# Patient Record
Sex: Female | Born: 1970 | Race: White | Hispanic: No | Marital: Married
Health system: Southern US, Community
[De-identification: ages and names within clinical notes are randomized; demographics above are authoritative.]

## PROBLEM LIST (undated history)

## (undated) ENCOUNTER — Ambulatory Visit: Admission: EM | Payer: Self-pay

---

## 2012-03-21 ENCOUNTER — Ambulatory Visit: Payer: Self-pay | Admitting: Internal Medicine

## 2012-03-21 LAB — RAPID INFLUENZA A&B ANTIGENS

## 2018-01-30 ENCOUNTER — Encounter: Payer: Self-pay | Admitting: *Deleted

## 2018-01-30 ENCOUNTER — Encounter (INDEPENDENT_AMBULATORY_CARE_PROVIDER_SITE_OTHER): Payer: Self-pay

## 2018-01-30 ENCOUNTER — Ambulatory Visit
Admission: RE | Admit: 2018-01-30 | Discharge: 2018-01-30 | Disposition: A | Discharge status: Home / Self Care | Payer: Self-pay | Source: Ambulatory Visit | Attending: Oncology | Admitting: Oncology

## 2018-01-30 ENCOUNTER — Ambulatory Visit: Payer: Self-pay | Attending: Oncology | Admitting: *Deleted

## 2018-01-30 VITALS — BP 134/87 | HR 72 | Temp 98.4°F | Ht 63.0 in | Wt 152.0 lb

## 2018-01-30 DIAGNOSIS — Z0000 Encounter for general adult medical examination without abnormal findings: Secondary | ICD-10-CM

## 2018-01-30 NOTE — Progress Notes (Addendum)
  Subjective:     Patient ID: Jo Hamilton, female   DOB: 11/20/1970, 47 y.o.   MRN: [id number]  HPI   Review of Systems     Objective:   Physical Exam  Pulmonary/Chest: Right breast exhibits no inverted nipple, no mass, no nipple discharge, no skin change and no tenderness. Left breast exhibits no inverted nipple, no mass, no nipple discharge, no skin change and no tenderness.         Assessment:     47 year old White [facility] and [facility].  On clinical breast exam bilateral inner quadrants have a fibroglandular like pattern.  The patient states she has had occasional bilateral nipple discharge on expression only since the birth of her child 16 years ago.  States she has not had any in "a long time".  States greater than a year ago.  States "it looked like the discharge you have before you have a baby".  Taught self breast awareness.  She was encouraged not to try to and express it anymore.  Last pap on 08/20/17 was negative / negative.  Next pap due in 2024.  Patient has been screened for eligibility.  She does not have any insurance, Medicare or Medicaid.  She also meets financial eligibility.  Hand-out given on the Affordable Care Act. Risk Assessment    Risk Scores      01/30/2018   Last edited by: Alta Corningover, Melissa G, CMA   5-year risk: 1.3 %   Lifetime risk: 12.3 %             Plan:     Screening mammogram ordered.  Patient is to call if she has any more discharge and I will reassess at that time. Will follow-up per [facility] protocol

## 2018-01-30 NOTE — Patient Instructions (Signed)
Gave patient hand-out, Women Staying Healthy, Active and Well from [facility], with education on breast health, pap smears, heart and colon health. 

## 2018-02-05 ENCOUNTER — Other Ambulatory Visit: Payer: Self-pay | Admitting: *Deleted

## 2018-02-05 DIAGNOSIS — N63 Unspecified lump in unspecified breast: Secondary | ICD-10-CM

## 2018-02-08 ENCOUNTER — Encounter: Payer: Self-pay | Admitting: Family Medicine

## 2018-02-13 ENCOUNTER — Ambulatory Visit
Admission: RE | Admit: 2018-02-13 | Discharge: 2018-02-13 | Disposition: A | Discharge status: Home / Self Care | Payer: Self-pay | Source: Ambulatory Visit | Attending: Oncology | Admitting: Oncology

## 2018-02-13 DIAGNOSIS — N63 Unspecified lump in unspecified breast: Secondary | ICD-10-CM

## 2018-03-11 ENCOUNTER — Encounter: Payer: Self-pay | Admitting: *Deleted

## 2018-03-11 NOTE — Progress Notes (Unsigned)
Letter mailed from the [facility] to inform patient of her normal mammogram results.  Patient is to follow-up with annual screening in one year.  HSIS to Christy. 

## 2019-04-04 ENCOUNTER — Telehealth: Payer: Self-pay

## 2019-04-07 NOTE — Progress Notes (Signed)
Patient pre-screened for [facility] eligibility due to COVID 19 precautions. Two patient identifiers used for verification that I was speaking to correct patient.  Patient to Present directly to [facility] 04/08/19 for [facility] screening mammogram. 

## 2019-04-08 ENCOUNTER — Ambulatory Visit
Admission: RE | Admit: 2019-04-08 | Discharge: 2019-04-08 | Disposition: A | Discharge status: Home / Self Care | Payer: Self-pay | Source: Ambulatory Visit | Attending: Oncology | Admitting: Oncology

## 2019-04-08 ENCOUNTER — Ambulatory Visit: Payer: Self-pay | Attending: Oncology | Admitting: *Deleted

## 2019-04-08 ENCOUNTER — Encounter: Payer: Self-pay | Admitting: *Deleted

## 2019-04-08 DIAGNOSIS — Z0000 Encounter for general adult medical examination without abnormal findings: Secondary | ICD-10-CM | POA: Insufficient documentation

## 2019-04-08 NOTE — Progress Notes (Signed)
  Subjective:     Patient ID: Jo Hamilton, female   DOB: 01/30/1971, 49 y.o.   MRN: [id number]  HPI   Review of Systems     Objective:   Physical Exam     Assessment:     Due to Covid 19 pandemic a televisit was used to enroll patient into our [facility] program and obtain her health history.  Patient denies any breast problems at this time.  Last pap on 08/20/17 was negative / negative.  Next pap due in 2024.  See Dondra Spry Model breast cancer risk assessment below> Risk Assessment    Risk Scores      04/08/2019 01/30/2018   Last edited by: Scarlett Presto, RN [place], Freada Bergeron, CMA   5-year risk: 1.3 % 1.3 %   Lifetime risk: 12.1 % 12.3 %            Plan:     Patient presented to the [facility] for her screening mammogram today.  Will follow up per [facility] protocol.

## 2019-04-09 ENCOUNTER — Other Ambulatory Visit: Payer: Self-pay | Admitting: *Deleted

## 2019-04-09 DIAGNOSIS — N63 Unspecified lump in unspecified breast: Secondary | ICD-10-CM

## 2019-04-21 ENCOUNTER — Ambulatory Visit: Payer: Self-pay

## 2019-04-28 ENCOUNTER — Ambulatory Visit
Admission: RE | Admit: 2019-04-28 | Discharge: 2019-04-28 | Disposition: A | Discharge status: Home / Self Care | Payer: Self-pay | Source: Ambulatory Visit | Attending: Oncology | Admitting: Oncology

## 2019-04-28 DIAGNOSIS — N63 Unspecified lump in unspecified breast: Secondary | ICD-10-CM

## 2019-05-01 ENCOUNTER — Encounter: Payer: Self-pay | Admitting: *Deleted

## 2019-05-01 NOTE — Progress Notes (Signed)
Letter mailed from the [facility] to inform patient of her normal mammogram results.  Patient is to follow-up with annual screening in one year. 

## 2020-07-22 IMAGING — MG DIGITAL SCREENING BILAT W/ TOMO W/ CAD
8 series · 8 of 24 positions shown · non-contrast
Comparison: Previous exam(s).

CLINICAL DATA: Screening.

EXAM:
DIGITAL SCREENING BILATERAL MAMMOGRAM WITH TOMO AND CAD

[R MLO synth-2D]
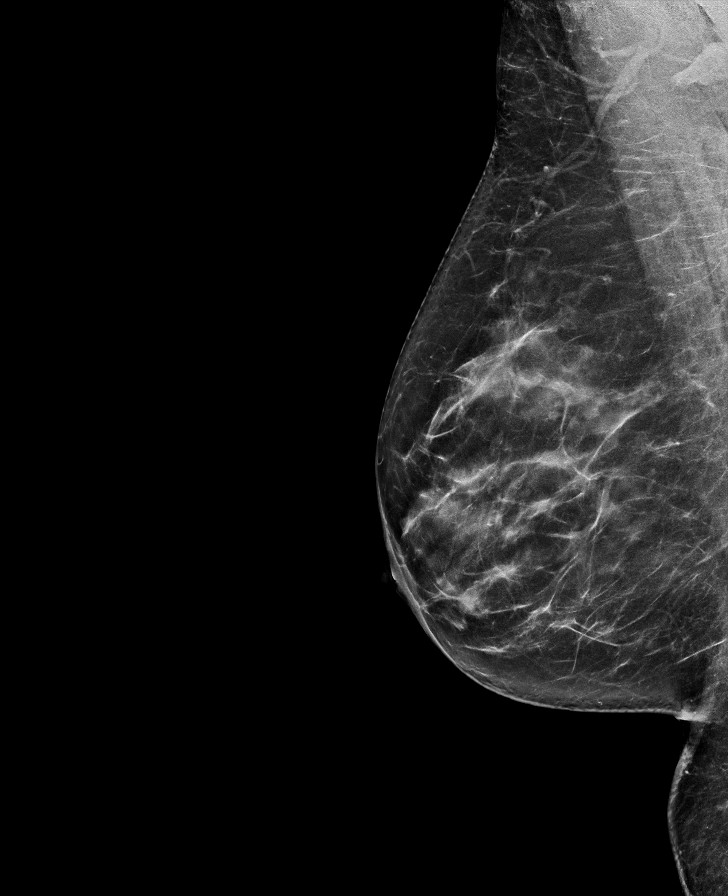

[L CC synth-2D]
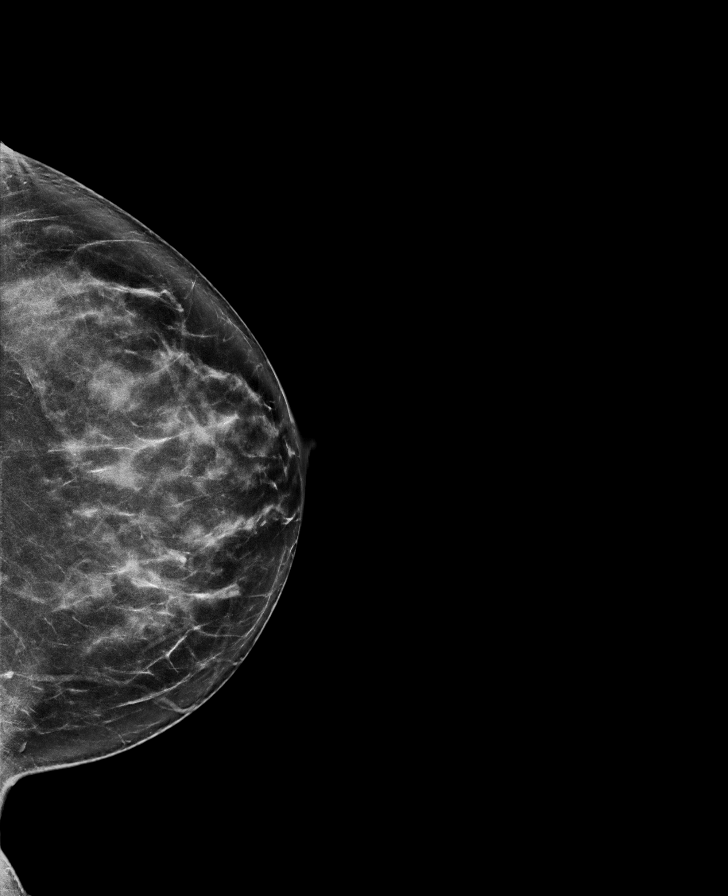

[R CC synth-2D]
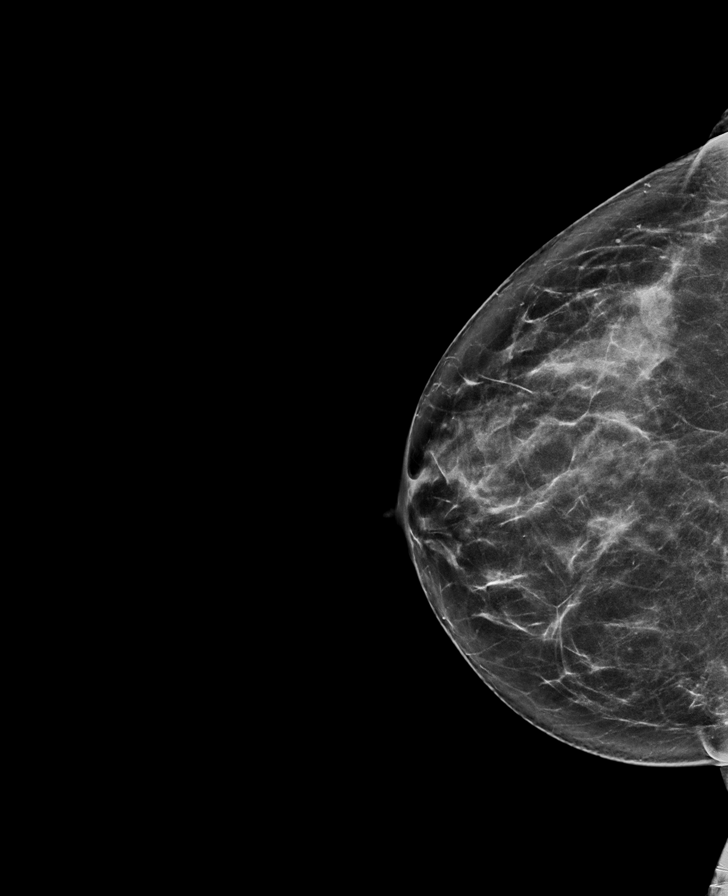

[L MLO synth-2D]
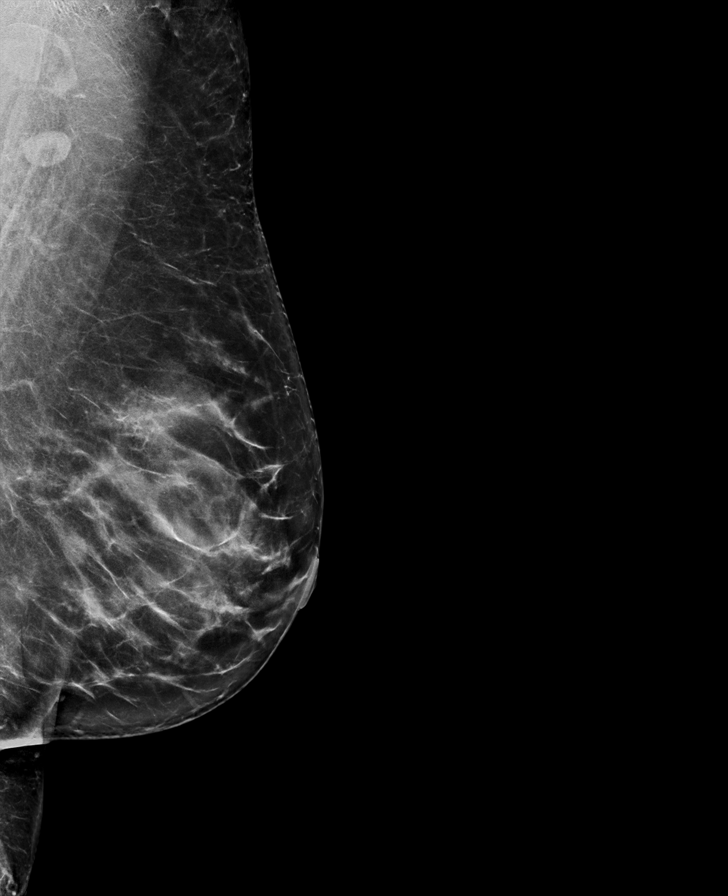

[R MLO tomo · tomo slice 37/72.0]
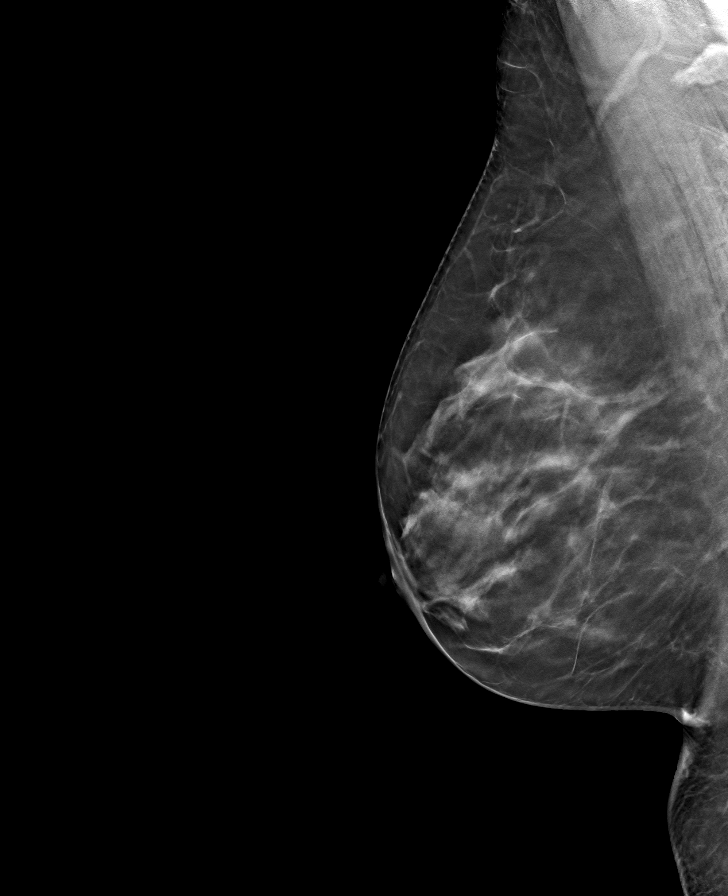

[R CC tomo · tomo slice 35/70.0]
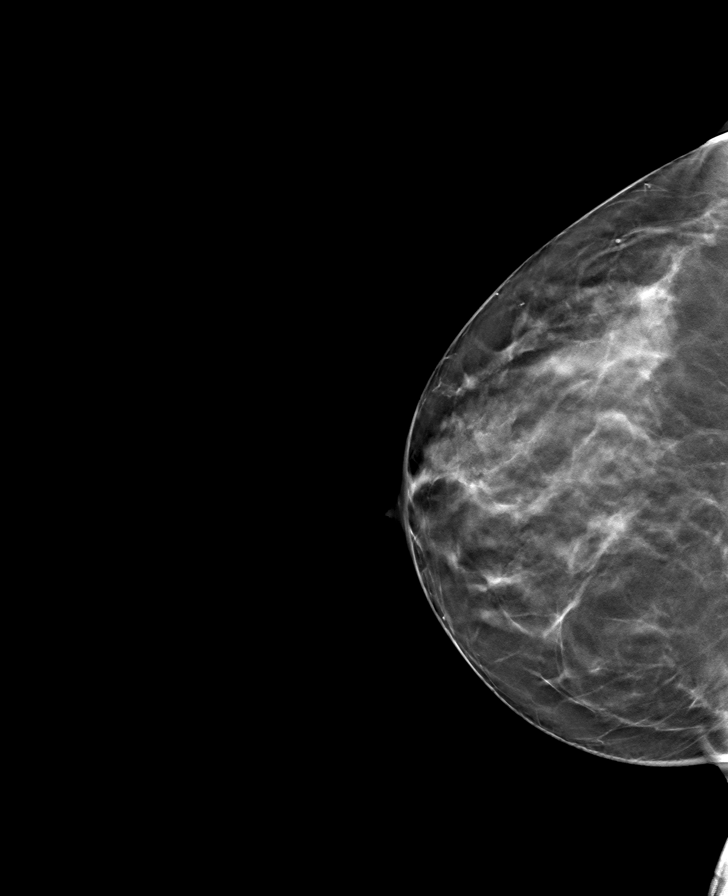

[L MLO tomo · tomo slice 41/80.0]
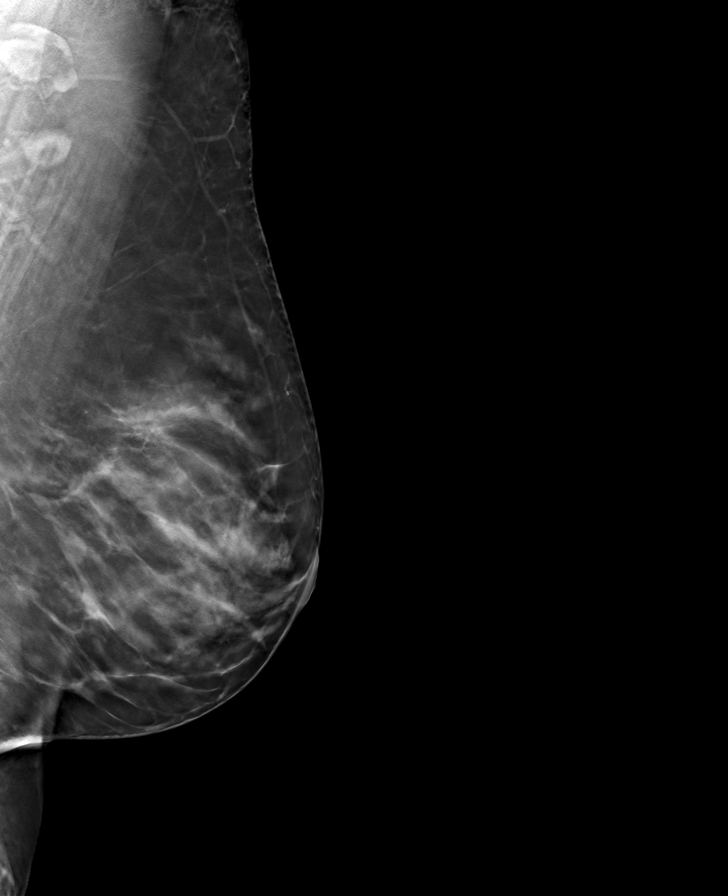

[L CC tomo · tomo slice 37/74.0]
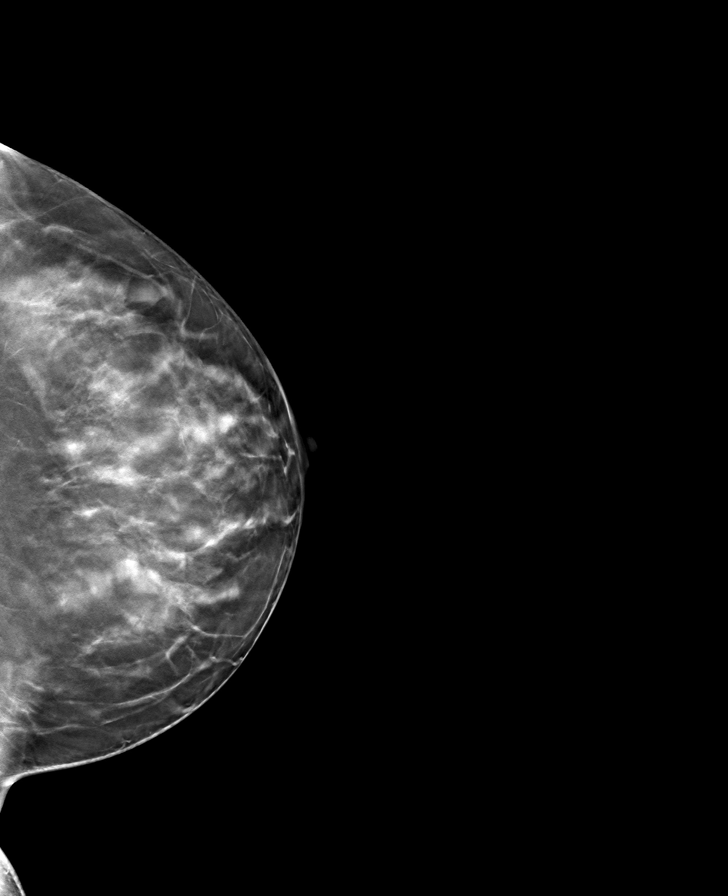

[8 of 24 positions shown; findings below may reference images not displayed]

ACR Breast Density Category c: The breast tissue is heterogeneously
dense, which may obscure small masses.
FINDINGS: In the left breast, a possible mass warrants further evaluation. In
the right breast, no findings suspicious for malignancy. Images were
processed with CAD.
IMPRESSION: Further evaluation is suggested for possible mass in the left
breast.

RECOMMENDATION:
Ultrasound of the left breast. (Code:[id number])

The patient will be contacted regarding the findings, and additional
imaging will be scheduled.

BI-RADS CATEGORY  0: Incomplete. Need additional imaging evaluation
and/or prior mammograms for comparison.

## 2020-08-11 IMAGING — US US BREAST*L* LIMITED INC AXILLA
1 series · 7 of 7 positions shown · non-contrast
Comparison: Previous exam(s).

CLINICAL DATA: Recall from screening to evaluate an oval
circumscribed mass over the outer midportion of the left breast.

EXAM:
ULTRASOUND OF THE LEFT BREAST

[Series 1: us breast*left* limited inc axilla · 0.07mm/px · 7 of 7 slices shown]
[im 1/7]
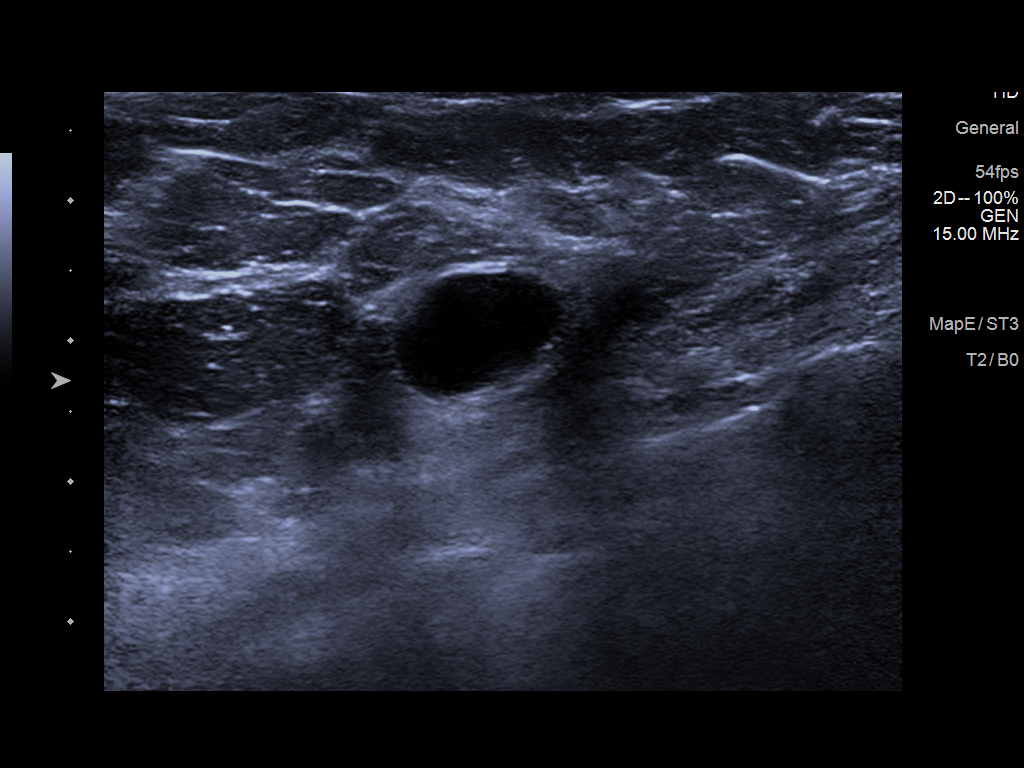
[im 2/7]
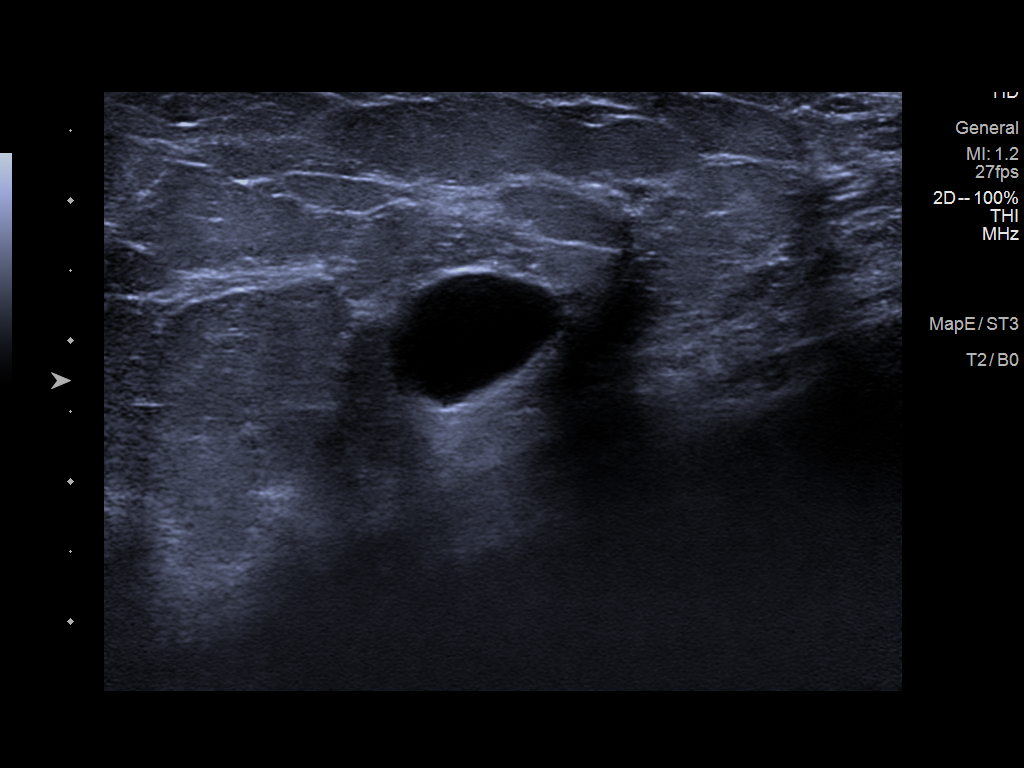
[im 3/7]
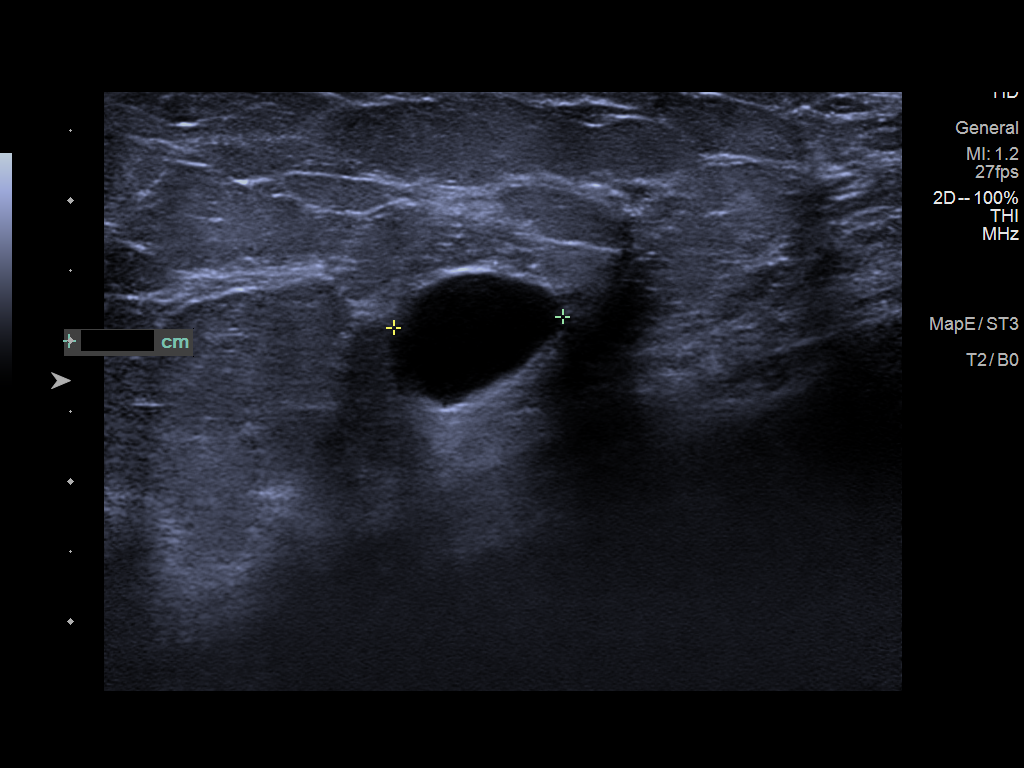
[im 4/7]
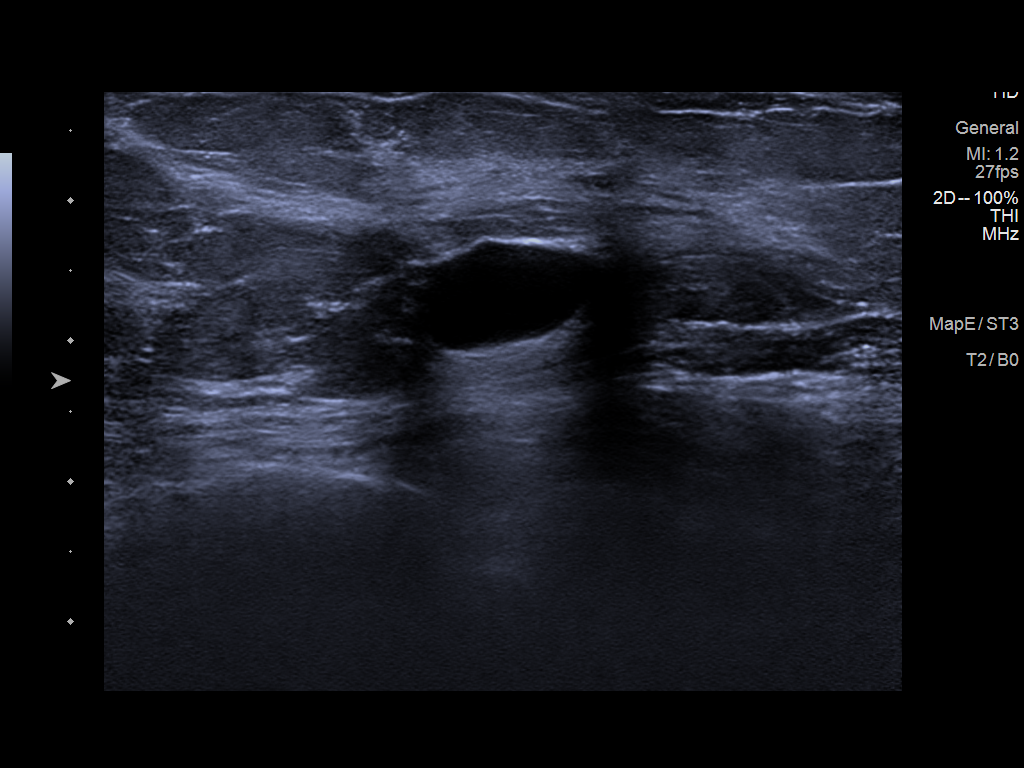
[im 5/7]
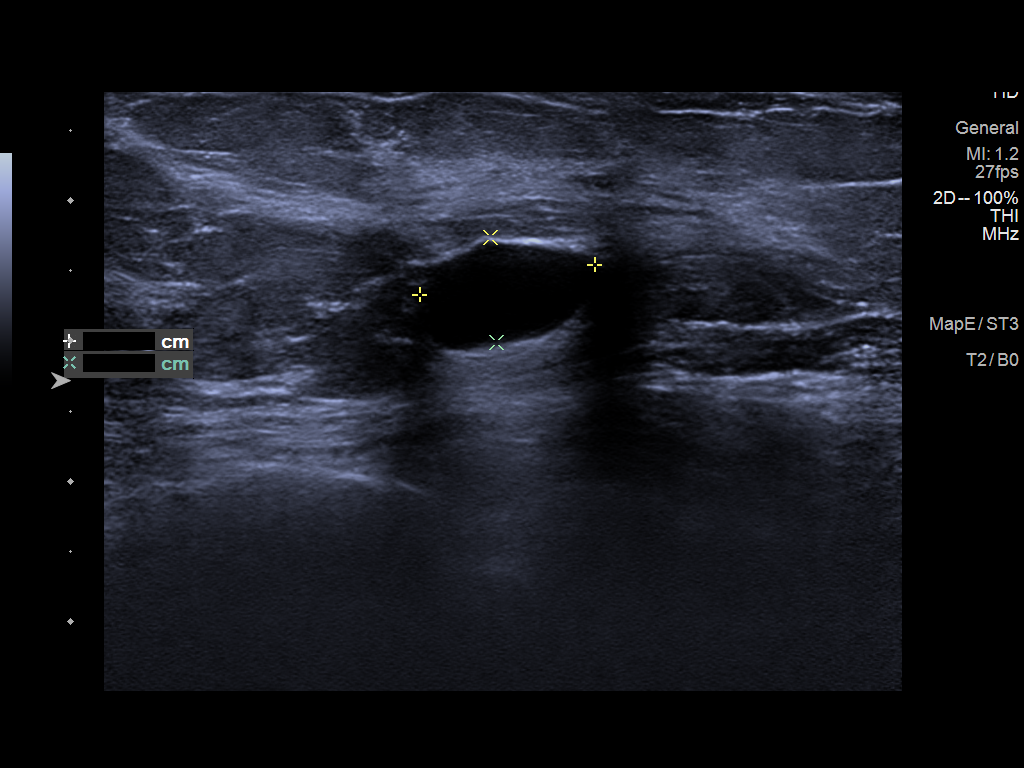
[im 6/7]
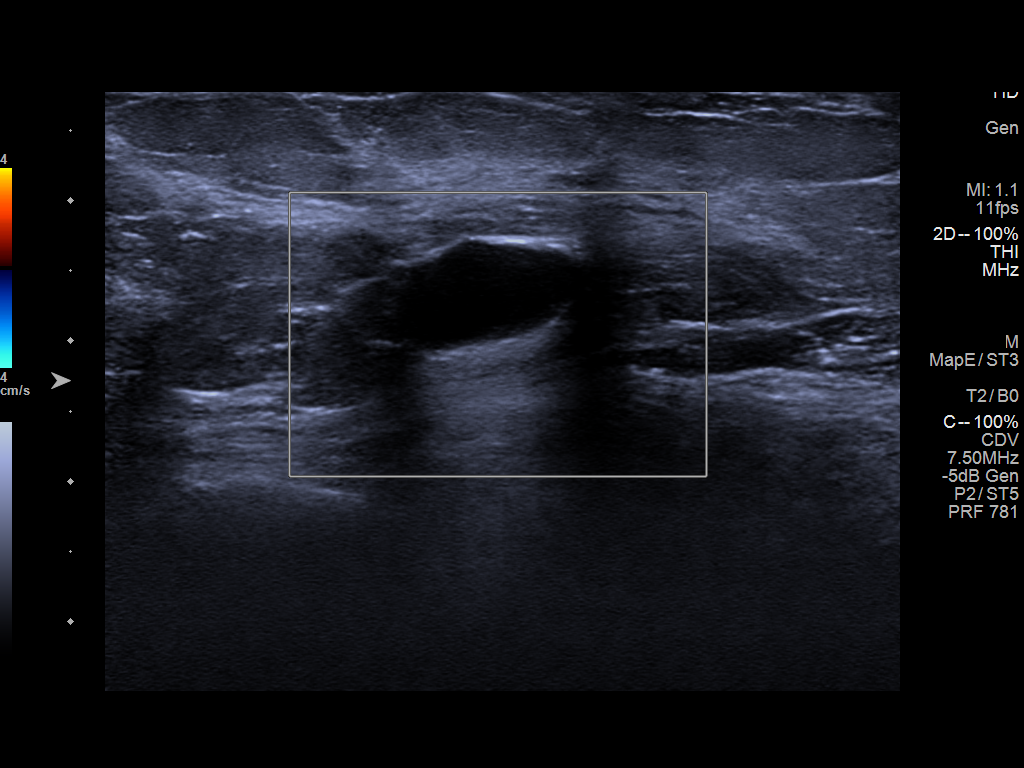
[im 7/7]
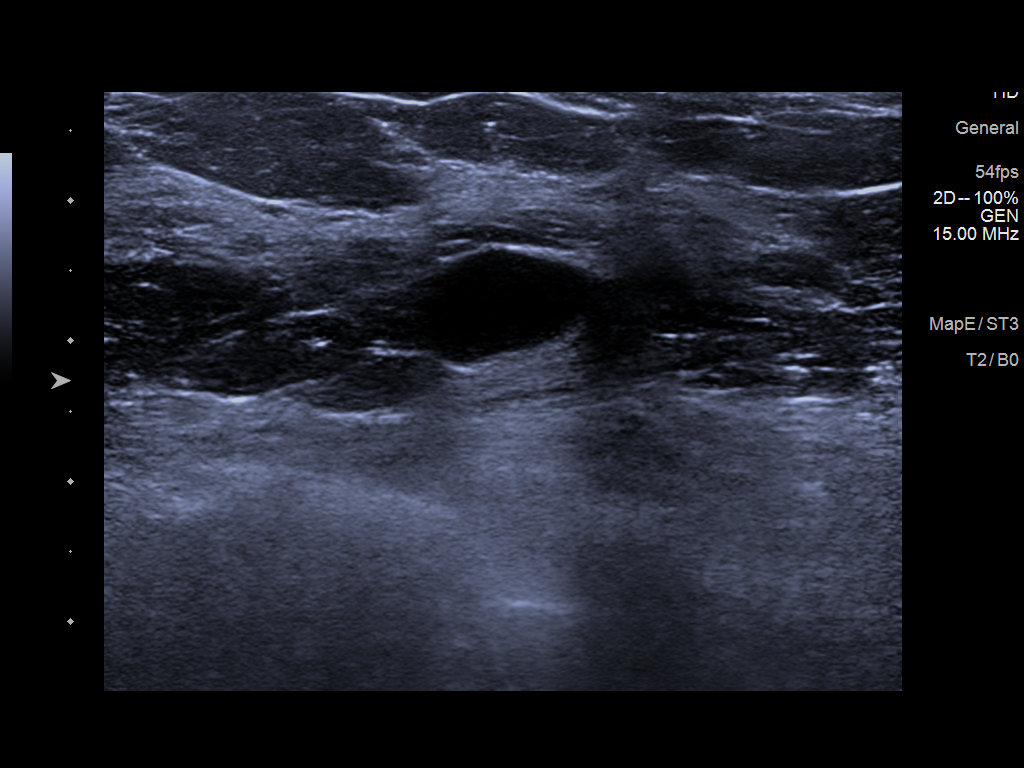

[7 of 7 positions shown; findings below may reference images not displayed]

FINDINGS: Targeted ultrasound is performed, showing an oval simple cyst over
the [DATE] position of the left breast 5 cm from the nipple measuring
0.8 x 1.2 x 1.3 cm. This correlates to the mammographic finding.
IMPRESSION: 1.3 cm simple cyst over the [DATE] position of the left breast
accounting for the mammographic finding.

RECOMMENDATION:
Recommend continued annual bilateral screening mammographic
follow-up.

I have discussed the findings and recommendations with the patient.
If applicable, a reminder letter will be sent to the patient
regarding the next appointment.

BI-RADS CATEGORY  2: Benign.

## 2020-09-11 LAB — COLOGUARD: COLOGUARD: NEGATIVE

## 2023-10-11 LAB — COLOGUARD: COLOGUARD: NEGATIVE

## 2023-12-26 ENCOUNTER — Ambulatory Visit: Payer: Self-pay
